# Patient Record
Sex: Male | Born: 1985 | Marital: Married | Smoking: Never smoker
Health system: Southern US, Community
[De-identification: ages and names within clinical notes are randomized; demographics above are authoritative.]

## PROBLEM LIST (undated history)

## (undated) DIAGNOSIS — E663 Overweight: Secondary | ICD-10-CM

## (undated) DIAGNOSIS — I4891 Unspecified atrial fibrillation: Secondary | ICD-10-CM

## (undated) DIAGNOSIS — K219 Gastro-esophageal reflux disease without esophagitis: Secondary | ICD-10-CM

## (undated) DIAGNOSIS — E7800 Pure hypercholesterolemia, unspecified: Secondary | ICD-10-CM

## (undated) HISTORY — DX: Pure hypercholesterolemia, unspecified: E78.00

## (undated) HISTORY — DX: Unspecified atrial fibrillation: I48.91

## (undated) HISTORY — DX: Overweight: E66.3

## (undated) HISTORY — PX: WISDOM TOOTH EXTRACTION: SHX21

## (undated) HISTORY — PX: MOLE REMOVAL: SHX2046

## (undated) HISTORY — DX: Gastro-esophageal reflux disease without esophagitis: K21.9

---

## 2014-04-20 ENCOUNTER — Inpatient Hospital Stay: Payer: Self-pay | Admitting: Internal Medicine

## 2014-04-20 DIAGNOSIS — I4891 Unspecified atrial fibrillation: Secondary | ICD-10-CM

## 2014-04-20 DIAGNOSIS — E785 Hyperlipidemia, unspecified: Secondary | ICD-10-CM

## 2014-04-20 LAB — LIPID PANEL
Cholesterol: 202 mg/dL — ABNORMAL HIGH (ref 0–200)
HDL Cholesterol: 32 mg/dL — ABNORMAL LOW (ref 40–60)
LDL CHOLESTEROL, CALC: 121 mg/dL — AB (ref 0–100)
TRIGLYCERIDES: 243 mg/dL — AB (ref 0–200)
VLDL CHOLESTEROL, CALC: 49 mg/dL — AB (ref 5–40)

## 2014-04-20 LAB — CBC
HCT: 49.9 % (ref 40.0–52.0)
HGB: 17.1 g/dL (ref 13.0–18.0)
MCH: 30.7 pg (ref 26.0–34.0)
MCHC: 34.4 g/dL (ref 32.0–36.0)
MCV: 89 fL (ref 80–100)
Platelet: 227 10*3/uL (ref 150–440)
RBC: 5.59 10*6/uL (ref 4.40–5.90)
RDW: 13.2 % (ref 11.5–14.5)
WBC: 8.5 10*3/uL (ref 3.8–10.6)

## 2014-04-20 LAB — DRUG SCREEN, URINE
Amphetamines, Ur Screen: NEGATIVE (ref ?–1000)
Barbiturates, Ur Screen: NEGATIVE (ref ?–200)
Benzodiazepine, Ur Scrn: NEGATIVE (ref ?–200)
COCAINE METABOLITE, UR ~~LOC~~: NEGATIVE (ref ?–300)
Cannabinoid 50 Ng, Ur ~~LOC~~: POSITIVE (ref ?–50)
MDMA (Ecstasy)Ur Screen: NEGATIVE (ref ?–500)
Methadone, Ur Screen: NEGATIVE (ref ?–300)
OPIATE, UR SCREEN: NEGATIVE (ref ?–300)
PHENCYCLIDINE (PCP) UR S: NEGATIVE (ref ?–25)
TRICYCLIC, UR SCREEN: NEGATIVE (ref ?–1000)

## 2014-04-20 LAB — BASIC METABOLIC PANEL
Anion Gap: 8 (ref 7–16)
BUN: 16 mg/dL (ref 7–18)
CALCIUM: 9.7 mg/dL (ref 8.5–10.1)
CREATININE: 1.02 mg/dL (ref 0.60–1.30)
Chloride: 107 mmol/L (ref 98–107)
Co2: 23 mmol/L (ref 21–32)
GLUCOSE: 92 mg/dL (ref 65–99)
OSMOLALITY: 277 (ref 275–301)
Potassium: 3.4 mmol/L — ABNORMAL LOW (ref 3.5–5.1)
SODIUM: 138 mmol/L (ref 136–145)

## 2014-04-20 LAB — TSH: Thyroid Stimulating Horm: 1.4 u[IU]/mL

## 2014-04-20 LAB — TROPONIN I
Troponin-I: <0.02 ng/mL
Troponin-I: <0.02 ng/mL

## 2014-04-20 LAB — D-DIMER(ARMC): D-Dimer: 263 ng/ml

## 2014-04-20 LAB — MAGNESIUM: MAGNESIUM: 1.7 mg/dL — AB

## 2014-04-21 DIAGNOSIS — I4891 Unspecified atrial fibrillation: Secondary | ICD-10-CM

## 2014-04-21 LAB — BASIC METABOLIC PANEL
ANION GAP: 5 — AB (ref 7–16)
BUN: 13 mg/dL (ref 7–18)
CHLORIDE: 109 mmol/L — AB (ref 98–107)
CO2: 26 mmol/L (ref 21–32)
CREATININE: 1.09 mg/dL (ref 0.60–1.30)
Calcium, Total: 9.3 mg/dL (ref 8.5–10.1)
GLUCOSE: 103 mg/dL — AB (ref 65–99)
OSMOLALITY: 280 (ref 275–301)
POTASSIUM: 4.8 mmol/L (ref 3.5–5.1)
SODIUM: 140 mmol/L (ref 136–145)

## 2014-04-21 LAB — CBC WITH DIFFERENTIAL/PLATELET
Basophil #: 0 10*3/uL (ref 0.0–0.1)
Basophil %: 0.3 %
EOS PCT: 0.9 %
Eosinophil #: 0.1 10*3/uL (ref 0.0–0.7)
HCT: 47.8 % (ref 40.0–52.0)
HGB: 16 g/dL (ref 13.0–18.0)
LYMPHS ABS: 3.6 10*3/uL (ref 1.0–3.6)
Lymphocyte %: 43.6 %
MCH: 30.1 pg (ref 26.0–34.0)
MCHC: 33.5 g/dL (ref 32.0–36.0)
MCV: 90 fL (ref 80–100)
Monocyte #: 0.8 x10 3/mm (ref 0.2–1.0)
Monocyte %: 9.3 %
Neutrophil #: 3.7 10*3/uL (ref 1.4–6.5)
Neutrophil %: 45.9 %
Platelet: 221 10*3/uL (ref 150–440)
RBC: 5.31 10*6/uL (ref 4.40–5.90)
RDW: 12.9 % (ref 11.5–14.5)
WBC: 8.2 10*3/uL (ref 3.8–10.6)

## 2014-04-26 ENCOUNTER — Telehealth: Payer: Self-pay | Admitting: *Deleted

## 2014-04-26 NOTE — Telephone Encounter (Signed)
LVM for TCM  

## 2014-05-12 ENCOUNTER — Encounter: Payer: Self-pay | Admitting: *Deleted

## 2014-05-13 ENCOUNTER — Ambulatory Visit (INDEPENDENT_AMBULATORY_CARE_PROVIDER_SITE_OTHER): Payer: BC Managed Care – PPO | Admitting: Nurse Practitioner

## 2014-05-13 ENCOUNTER — Encounter: Payer: Self-pay | Admitting: Nurse Practitioner

## 2014-05-13 VITALS — BP 120/78 | HR 70 | Ht 76.0 in | Wt 248.0 lb

## 2014-05-13 DIAGNOSIS — I4891 Unspecified atrial fibrillation: Secondary | ICD-10-CM | POA: Insufficient documentation

## 2014-05-13 DIAGNOSIS — K219 Gastro-esophageal reflux disease without esophagitis: Secondary | ICD-10-CM | POA: Insufficient documentation

## 2014-05-13 DIAGNOSIS — E663 Overweight: Secondary | ICD-10-CM | POA: Insufficient documentation

## 2014-05-13 DIAGNOSIS — E7800 Pure hypercholesterolemia, unspecified: Secondary | ICD-10-CM

## 2014-05-13 NOTE — Patient Instructions (Addendum)
Your physician wants you to follow-up in: 6 months with Dr. Gollan. You will receive a reminder letter in the mail two months in advance. If you don't receive a letter, please call our office to schedule the follow-up appointment.    Your physician recommends that you continue on your current medications as directed. Please refer to the Current Medication list given to you today.     

## 2014-05-13 NOTE — Progress Notes (Signed)
   Patient Name: Edwin Fleming Date of Encounter: 05/13/2014  Primary Care Provider:  Elijio Miles, MD Primary Cardiologist:  Concha Se, MD   Patient Profile  28 y/o male who was hospitalized in April with PAF.  Problem List   Past Medical History  Diagnosis Date  . Hypercholesterolemia     a. on tricor  . GERD (gastroesophageal reflux disease)   . A-fib     a. Dx 03/2014 - CHADS2= 0-->broke with amio in hospital, now on Dilt CD;  b. 03/2014 Echo: EF 55-60%, borderline concentric LVH.  Marland Kitchen Overweight    Past Surgical History  Procedure Laterality Date  . Wisdom tooth extraction    . Mole removal      Allergies  No Known Allergies  HPI  28 y/o male with the above problem list.  In mid-April, he was partying with some friends and experimented with MDMA Kirt Boys).  His wife became tachycardic and was seen in the ED.  He did not have any ill effects.  About 2 wks later however, he developed palpitations while @ home.  He presented to the [facility] ED and was found to be in afib.  He was treated with diltiazem with a plan to add oral amiodarone however he converted prior to receiving any amio.  Echo showed nl LV fxn.  CHA2DS2VASC = 0.  He was discharged on dilt CD 120mg  daily.  Following d/c, he did have some constipation but has increased his fluid intake and added more fiber to his diet with relative resolution of constipation.  Overall, he is tolerating diltiazem well and had not had any recurrent palpitations/afib.  He has ceased from using marijuana and alcohol.  He denies chest pain, palpitations, dyspnea, pnd, orthopnea, n, v, dizziness, syncope, edema, weight gain, or early satiety.   Home Medications  Prior to Admission medications   Medication Sig Start Date End Date Taking? Authorizing Provider  diltiazem (CARDIZEM CD) 120 MG 24 hr capsule Take 120 mg by mouth daily.   Yes Historical Provider, MD  fenofibrate 160 MG tablet Take 160 mg by mouth daily.   Yes Historical Provider, MD     Review of Systems  Constipation as above.  Otherwise all systems reviewed and are otherwise negative except as noted above.  Physical Exam  Blood pressure 120/78, pulse 70, height 6\' 4"  (1.93 m), weight 248 lb (112.492 kg).  General: Pleasant, NAD Psych: Normal affect. Neuro: Alert and oriented X 3. Moves all extremities spontaneously. HEENT: Normal  Neck: Supple without bruits or JVD. Lungs:  Resp regular and unlabored, CTA. Heart: RRR no s3, s4, or murmurs. Abdomen: Soft, non-tender, non-distended, BS + x 4.  Extremities: No clubbing, cyanosis or edema. DP/PT/Radials 2+ and equal bilaterally.  Accessory Clinical Findings  ECG - RSR, 70, no acute ST/T changes.  Assessment & Plan  1.  PAF:  Maintaining sinus on dilt cd.  Tolerating reasonably well after experiencing some constipation initially.  CHA2DS2VASc = 0.  No oral anticoagulation indicated.  We discussed the importance of continued drug and alcohol cessation.  He has completely cut out MJ, Molly, and etoh.  2.  HL:  Followed by his PCP.  He is on tricor.  3.  Dispo:  F/U Dr. Mariah Milling in six mos or sooner if necessary.   Ok Anis, NP 05/13/2014, 1:45 PM

## 2014-05-31 ENCOUNTER — Other Ambulatory Visit: Payer: Self-pay

## 2014-05-31 MED ORDER — DILTIAZEM HCL ER COATED BEADS 120 MG PO CP24: 120.0000 mg | ORAL_CAPSULE | Freq: Every day | ORAL | Status: DC

## 2014-06-23 ENCOUNTER — Other Ambulatory Visit: Payer: Self-pay

## 2014-06-23 MED ORDER — DILTIAZEM HCL ER COATED BEADS 120 MG PO CP24: 120.0000 mg | ORAL_CAPSULE | Freq: Every day | ORAL | Status: DC

## 2014-07-26 ENCOUNTER — Other Ambulatory Visit: Payer: Self-pay | Admitting: *Deleted

## 2014-07-26 MED ORDER — DILTIAZEM HCL ER COATED BEADS 120 MG PO CP24: 120.0000 mg | ORAL_CAPSULE | Freq: Every day | ORAL | Status: DC

## 2014-07-26 NOTE — Telephone Encounter (Signed)
90 day supply

## 2015-04-15 NOTE — H&P (Signed)
PATIENT NAME:  Edwin Fleming, [name] MR#:  [id number] DATE OF BIRTH:  07-07-1986  DATE OF ADMISSION:  04/20/2014  PRIMARY CARE PHYSICIAN: None.   REFERRING EMERGENCY ROOM PHYSICIAN: Loraine LericheMark R. Quale, MD  CHIEF COMPLAINT: Chest tightness and palpitations.   HISTORY OF PRESENT ILLNESS: This is a 29 year old male with past medical history of hypercholesterolemia, on fenofibrate, living his active and healthy life. Today morning after dropping his son for school, he started feeling chest tightness around 7:30 in the morning. He was also feeling somewhat short of breath with palpitations and lightheadedness. He decided to remain in the bed. He stayed in the bed for 3 to 4 hours, up to 11 a.m. The complaint was still there, so he decided to come to the Emergency Room. In the ER, he was found having atrial fibrillation with rapid ventricular response, rate ranging about 150 and 160. Given one injection of Cardizem 15 mg, which helped to slow down the heart rate to 100, and so given as admission to hospitalist team. On further questioning, he denied any similar episode in the past. Denied excessive coffee or chocolate consumption. Agrees that he uses marijuana almost every day, but this thing never happened before to him. When I went to examine him, his heart rate was again ranging from 130 and 140, so spoke to nurse and gave an injection of Cardizem 15 mg again, and that helped to slow down the heart rate to 100.  REVIEW OF SYSTEMS:   CONSTITUTIONAL: Negative for fever, fatigue, weakness. Has chest tightness. No weight loss or weight gain.  EYES: No blurring, double vision, discharge or redness.  EARS, NOSE, THROAT: No tinnitus, ear pain or hearing loss.  RESPIRATORY: No cough, wheezing, hemoptysis or shortness of breath.  CARDIOVASCULAR: The patient had chest tightness, palpitations, and feeling of lightheaded. GASTROINTESTINAL: No nausea, vomiting, diarrhea, abdominal pain.  GENITOURINARY: No dysuria, hematuria  or increased frequency.  ENDOCRINE: No heat or cold intolerance. No excessive sweating.  SKIN: No acne or rashes.  MUSCULOSKELETAL: No pain or swelling in the joints.  NEUROLOGICAL: No numbness, weakness, tremor or vertigo. PSYCHIATRIC: No anxiety, insomnia, bipolar disorder.   PAST MEDICAL HISTORY: Hypercholesterolemia.   PAST SURGICAL HISTORY: Wisdom tooth removal.  HOME MEDICATIONS: Fenofibrate.   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 Does not smoke. Drinks 1 to 2 beers almost every day and does marijuana almost every day.   FAMILY HISTORY: Positive for heart disease and stroke on mother's side of family and cancer in father's side of family.   PHYSICAL EXAMINATION: VITAL SIGNS: In ER, temperature 98.4. Pulse 93, went up to 130 and 140, and then finally slowed down to 100. Blood pressure 143/99. Pulse oximetry 100% on room air.  GENERAL: The patient is fully alert and oriented to time, place, and person. Does not appear in any acute distress.  HEENT: Head and neck atraumatic. Conjunctivae pink. Oral mucosa moist.  NECK: Supple. No JVD.  RESPIRATORY: Bilateral equal and clear air entry.  CARDIOVASCULAR: S1, S2 present, irregular. No murmur present. Tachycardia.  ABDOMEN: Soft, nontender. Bowel sounds present. No organomegaly.  MUSCULOSKELETAL: Joints: No swelling or tenderness.  CENTRAL NERVOUS SYSTEM: Power 5/5. Follows commands. Moves all 4 limbs. No gross abnormality. No tremors.  PSYCHIATRIC: Does not appear in any acute psychiatric illness at this time.  IMPORTANT LABORATORY, DIAGNOSTIC, AND RADIOLOGICAL DATA: Glucose 92, BUN 16, creatinine 1.02, sodium 138, potassium 3.4; chloride is 107; CO2 is 23. LDL is 121. Calcium is 9.7. Cholesterol 202, triglycerides  243; HDL is 32. Troponin less than 0.02. TSH is 1.4. Urine for toxicology is positive for cannabinoid. WBC 8.5, hemoglobin 17.1, platelet count 227; MCV is 89. D-dimer is 263. Chest x-ray, portable: No acute finding. EKG  shows A. fib with ventricular rapid response, rate up to 120.   ASSESSMENT AND PLAN: This is a 29 year old male with past history of hypercholesterolemia, came to Emergency Room with atrial fibrillation with rapid ventricular response, responded to 2 injections of Cardizem.  1.  Atrial fibrillation with rapid ventricular response: He responded to Cardizem injections twice. Will keep him on Cardizem 60 mg oral every 6 hours and will get cardiology consult. TSH is normal. Urine for toxicology is positive for cannabinoid. Will get echocardiogram done and wait for cardiology evaluation. Whether to start him on anticoagulation or not, I will leave it up to cardiologist, as he has CHADS2 score very low.  2.  Hypokalemia: Will replace it orally and will check magnesium level.  3.  Hypercholesterolemia: He is already on fenofibrate. Still, his total cholesterol is high. I will start him on simvastatin.   CODE STATUS: Full code.   TOTAL TIME SPENT ON THIS ADMISSION: 50 minutes critical care because of requirement of IV rate-controlling medication. Will continue monitoring on telemetry and if he starts having tachycardia again, he might need to transfer to stepdown unit with IV Cardizem drip. Plan explained to the patient and his mother, who is present in the room.    ____________________________ Edwin Fleming Elisabeth Pigeon, MD [id number] D: 04/20/2014 15:08:20 ET T: 04/20/2014 15:25:34 ET JOB#: [id number]  cc: Edwin Fleming. Elisabeth Pigeon, MD, <Dictator> Altamese Dilling MD ELECTRONICALLY SIGNED 05/02/2014 22:16

## 2015-04-15 NOTE — Consult Note (Signed)
General Aspect 29 year old male with past medical history of hypercholesterolemia, on fenofibrate, who woke with tachypalpitations, SOB.  Cardiology was consulted for atrial fibrillation with RVR.  This  morning at 7:30 AM after dropping his son at school, he started feeling chest tightness. He was also feeling somewhat short of breath with palpitations and lightheadedness. He went to bed for 3 to 4 hours, up to 11 a.m. Symptoms persisted and he decided to come to the Emergency Room.   In the ER, he was in atrial fibrillation with rapid ventricular response, rate ranging about 150 and 160. He was given one injection of Cardizem 15 mg, with improvemtn of rate to 100.   On further questioning, he denied any similar episode in the past. Something happened when he was in high school with something similart "yet different".  Denied excessive coffee or chocolate consumption. Agrees that he uses marijuana almost every day, but this thing never happened before to him.  He currently feels better, still not at baseline.   Present Illness . 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 Drinks 1 to 2 beers almost every day and does marijuana almost every day.   FAMILY HISTORY: Positive for heart disease and stroke on mother's side of family and cancer in father's side of family.   Physical Exam:  GEN well developed, well nourished, no acute distress   HEENT hearing intact to voice, moist oral mucosa   NECK supple   RESP normal resp effort  clear BS   CARD Irregular rate and rhythm  Tachycardic  No murmur   ABD denies tenderness  soft   LYMPH negative neck   EXTR negative edema   SKIN normal to palpation   NEURO motor/sensory function intact   PSYCH alert, A+O to time, place, person, good insight   Review of Systems:  Subjective/Chief Complaint tachypalpitations, SOB   General: Weakness   Skin: No Complaints   ENT: No Complaints   Eyes: No Complaints   Neck: No  Complaints   Respiratory: Short of breath   Cardiovascular: Palpitations  Dyspnea   Gastrointestinal: No Complaints   Genitourinary: No Complaints   Vascular: No Complaints   Musculoskeletal: No Complaints   Neurologic: No Complaints   Hematologic: No Complaints   Endocrine: No Complaints   Psychiatric: No Complaints   Review of Systems: All other systems were reviewed and found to be negative   Medications/Allergies Reviewed Medications/Allergies reviewed   Family & Social History:  Family and Social History:  Family History Non-Contributory  Negative   Social History positive  tobacco, positive  tobacco (Current within 1 year), MAJ   Place of Living Home     GERD - Esophageal Reflux:    Hypercholesterolemia:    None, patient reports no surgical history.:        Admit Diagnosis:   A FIB: Onset Date: 20-Apr-2014, Status: Active, Description: A FIB  Home Medications: Medication Instructions Status  fenofibrate 160 mg oral tablet 1 tab(s) orally once a day (at bedtime) Active   Lab Results:  Thyroid:  29-Apr-15 12:22   Thyroid Stimulating Hormone 1.40 (0.45-4.50 (International Unit)  ----------------------- Pregnant patients have  different reference  ranges for TSH:  - - - - - - - - - -  Pregnant, first trimetser:  0.36 - 2.50 uIU/mL)  Routine Chem:  29-Apr-15 12:22   Magnesium, Serum  1.7 (1.8-2.4 THERAPEUTIC RANGE: 4-7 mg/dL TOXIC: > 10 mg/dL  -----------------------)  Cholesterol, Serum  202  Triglycerides, Serum  243  HDL (INHOUSE)  32  VLDL Cholesterol Calculated  49  LDL Cholesterol Calculated  121 (Result(s) reported on 20 Apr 2014 at 03:13PM.)  Glucose, Serum 92  BUN 16  Creatinine (comp) 1.02  Sodium, Serum 138  Potassium, Serum  3.4  Chloride, Serum 107  CO2, Serum 23  Calcium (Total), Serum 9.7  Anion Gap 8  Osmolality (calc) 277  eGFR (African American) >60  eGFR (Non-African American) >60 (eGFR values <45m/min/1.73 m2  may be an indication of chronic kidney disease (CKD). Calculated eGFR is useful in patients with stable renal function. The eGFR calculation will not be reliable in acutely ill patients when serum creatinine is changing rapidly. It is not useful in  patients on dialysis. The eGFR calculation may not be applicable to patients at the low and high extremes of body sizes, pregnant women, and vegetarians.)  Urine Drugs:  [id number]  Tricyclic Antidepressant, Ur Qual (comp) NEGATIVE (Result(s) reported on 20 Apr 2014 at 03:16PM.)  Amphetamines, Urine Qual. NEGATIVE  MDMA, Urine Qual. NEGATIVE  Cocaine Metabolite, Urine Qual. NEGATIVE  Opiate, Urine qual NEGATIVE  Phencyclidine, Urine Qual. NEGATIVE  Cannabinoid, Urine Qual. POSITIVE  Barbiturates, Urine Qual. NEGATIVE  Benzodiazepine, Urine Qual. NEGATIVE (----------------- The URINE DRUG SCREEN provides only a preliminary, unconfirmed analytical test result and should not be used for non-medical  purposes.  Clinical consideration and professional judgment should be  applied to any positive drug screen result due to possible interfering substances.  A more specific alternate chemical method must be used in order to obtain a confirmed analytical result.  Gas chromatography/mass spectrometry (GC/MS) is the preferred confirmatory method.)  Methadone, Urine Qual. NEGATIVE  Cardiac:  29-Apr-15 12:22   Troponin I < 0.02 (0.00-0.05 0.05 ng/mL or less: NEGATIVE  Repeat testing in 3-6 hrs  if clinically indicated. >0.05 ng/mL: POTENTIAL  MYOCARDIAL INJURY. Repeat  testing in 3-6 hrs if  clinically indicated. NOTE: An increase or decrease  of 30% or more on serial  testing suggests a  clinically important change)  Routine Coag:  29-Apr-15 12:22   D-Dimer, Quantitative 263 (INTERPRETATION <> Exclusion of Venous Thromboembolism (VTE) - OUTPATIENT ONLY       (Emergency Department or [place])             0-499 ng/ml (FEU)  : With a  low to intermediate pretest                                  probability for VTE this test result                                  excludes the diagnosis of VTE.             > 499 ng/ml (FEU)  : VTE not excluded; additional work up                                  for VTE is required. <> Testing on Inpatients and Evaluation of Disseminated Intravascular        Coagulation (DIC)             Reference Range:  0-499 ng/ml (FEU))  Routine Hem:  29-Apr-15 12:22   WBC (CBC) 8.5  RBC (CBC) 5.59  Hemoglobin (CBC) 17.1  Hematocrit (CBC) 49.9  Platelet Count (CBC) 227 (Result(s) reported on 20 Apr 2014 at 12:41PM.)  MCV 89  MCH 30.7  MCHC 34.4  RDW 13.2   EKG:  Interpretation EKG with atrial fibrillation with rate 160 bpm, no significant ST or T wave changes   Radiology Results: XRay:    29-Apr-15 12:21, Chest Portable Single View  Chest Portable Single View   REASON FOR EXAM:    chest pain/sob  COMMENTS:       PROCEDURE: DXR - DXR PORTABLE CHEST SINGLE VIEW  - Apr 20 2014 12:21PM     CLINICAL DATA:  Chest pain and shortness of breath.    EXAM:  PORTABLE CHEST - 1 VIEW    COMPARISON:  None.    FINDINGS:  Trachea is midline. Heart size normal. Lungs are clear. No pleural  fluid.   IMPRESSION:  No acute findings.      Electronically Signed    By: Lorin Picket M.D.    On: 04/20/2014 12:55         Verified By: Luretha Rued, M.D.,    No Known Allergies:   Vital Signs/Nurse's Notes: **Vital Signs.:   29-Apr-15 15:15  Vital Signs Type Admission  Temperature Temperature (F) 98.1  Celsius 36.7  Temperature Source oral  Pulse Pulse 115  Respirations Respirations 18  Systolic BP Systolic BP 891  Diastolic BP (mmHg) Diastolic BP (mmHg) 79  Mean BP 92  Pulse Ox % Pulse Ox % 99  Pulse Ox Activity Level  At rest    Impression 29 year old male with past medical history of hypercholesterolemia, on fenofibrate, who woke with tachypalpitations, SOB.  Cardiology was  consulted for atrial fibrillation with RVR.  1) Atrial fibrillation with RVR no precipitating cause noted. borderline low potassium 3.5 MAj smoking Less than 12 hours of atrial fib, will try to convert back to NSR --Will start amiodarone 400 mg po x 1 now, repeat in 6 hours then continue amio 400 mg po BID COntinue diltiazem 60 Q6 --echo pending If NSR not restored, consider D/c with eliquis and above meds outpt follow up  2) Hyperlipidemia on fenofibrate consider low dose statin  3) MAJ smoking recommended cessation   Electronic Signatures: Ida Rogue (MD)  (Signed 29-Apr-15 19:01)  Authored: General Aspect/Present Illness, History and Physical Exam, Review of System, Family & Social History, Past Medical History, Health Issues, Home Medications, Labs, EKG , Radiology, Allergies, Vital Signs/Nurse's Notes, Impression/Plan   Last Updated: 29-Apr-15 19:01 by Ida Rogue (MD)

## 2015-04-15 NOTE — Discharge Summary (Signed)
PATIENT NAME:  Edwin Fleming, Edwin Fleming MR#:  [id number] DATE OF BIRTH:  1986/08/26  DATE OF ADMISSION:  04/20/2014 DATE OF DISCHARGE:  04/21/2014  ADMITTING PHYSICIAN:  Dr. Elisabeth Fleming. DISCHARGING PHYSICIAN:  Edwin Fleming, M.D.   PRIMARY CARE PHYSICIAN: Currently none.   CONSULTATIONS IN THE HOSPITAL: Cardiology consultation by Dr. Julien Nordmannimothy Fleming.   DISCHARGE DIAGNOSES: 1. Atrial fibrillation with rapid ventricular response, converted to normal sinus rhythm at discharge.  2.  Hyperlipidemia.  3.  Marijuana abuse.   DISCHARGE MEDICATIONS:   1.  Fenofibrate 160 mg p.o. daily.  2.  Cardizem 120 mg p.o. daily.   DISCHARGE DIET:  Regular.   DISCHARGE ACTIVITY:  As tolerated.     FOLLOWUP INSTRUCTIONS:  Cardiology followup in 1 to 2 weeks.   LABORATORY DATA AND IMAGING STUDIES PRIOR TO DISCHARGE:   WBC 8.2, hemoglobin 16, hematocrit 47.8, platelet count 221.   Sodium 140, potassium 4.8, chloride 109, bicarb 26, BUN 13, creatinine 1.09, glucose 103 and calcium of 9.3. Troponins were negative. Urine tox screen is positive for marijuana. Lipid profile showing LDL cholesterol of 121, HDL 32, total cholesterol [id number], triglycerides 243. Chest x-ray showing clear lung fields. No acute cardiopulmonary disease. Echo Doppler showing LV ejection fraction of 55% to 60%, concentrate LVH noted.   BRIEF HOSPITAL COURSE:  Mr. Edwin Fleming is a 29 year old young Caucasian male with past medical history significant for marijuana abuse and occasional MDMA use, comes to the hospital secondary to palpitations, noted to be in Afib with RVR.  1.  Paroxysmal atrial fibrillation with rapid ventricular response. I am not sure what the triggering factor is.  With the oral loading of amiodarone, the patient converted back to normal sinus rhythm and has been asymptomatic since then. The patient was started on low-dose Cardizem in the hospital and will be discharged on maintenance. No need for amiodarone because of his young age and  the impending side effects with amiodarone. His CHADS2 score is 0, so he will not be needing any anticoagulation at this time. The patient was seen by cardiology in the hospital and will follow up with them as an outpatient.   His course has been otherwise uneventful in the hospital. He will continue his fenofibrate that he has been taking for hyperlipidemia after discharge as well.   2.  Hyperkalemia; that was replaced in the hospital.   DISCHARGE CONDITION:  Stable.   DISCHARGE DISPOSITION:  Home.   TIME SPENT ON DISCHARGE: 45 minutes.    ____________________________ Edwin Baasadhika Lynda Wanninger, MD rk:dmm D: 04/21/2014 14:09:12 ET T: 04/21/2014 20:16:11 ET JOB#: [id number]  cc: Edwin Baasadhika Raydell Maners, MD, <Dictator> Antonieta Ibaimothy J. Gollan, MD Edwin BaasADHIKA Tylisha Danis MD ELECTRONICALLY SIGNED 05/11/2014 14:01

## 2015-07-24 ENCOUNTER — Telehealth: Payer: Self-pay

## 2015-07-24 ENCOUNTER — Other Ambulatory Visit: Payer: Self-pay | Admitting: Nurse Practitioner

## 2015-07-24 NOTE — Telephone Encounter (Signed)
l mom to call and schedule f/u 

## 2015-07-24 NOTE — Telephone Encounter (Signed)
-----   Message from Sharon G Crespo, CMA sent at 07/24/2015  9:12 AM EDT ----- Patient needs a follow up appointment with Dr. Gollan.  THanks, [place]  

## 2015-08-03 ENCOUNTER — Telehealth: Payer: Self-pay

## 2015-08-03 NOTE — Telephone Encounter (Signed)
-----   Message from Festus Aloe, CMA sent at 07/24/2015  9:12 AM EDT ----- Patient needs a follow up appointment with Dr. Mariah Milling.  THanks, [employer]

## 2015-08-03 NOTE — Telephone Encounter (Signed)
Called pt to schedule past due appt, states he is driving and will call back to schedule

## 2016-01-21 IMAGING — CR DG CHEST 1V PORT
1 series · 1 of 1 positions shown · non-contrast
Comparison: None.

CLINICAL DATA: Chest pain and shortness of breath.

EXAM:
PORTABLE CHEST - 1 VIEW

[ap]
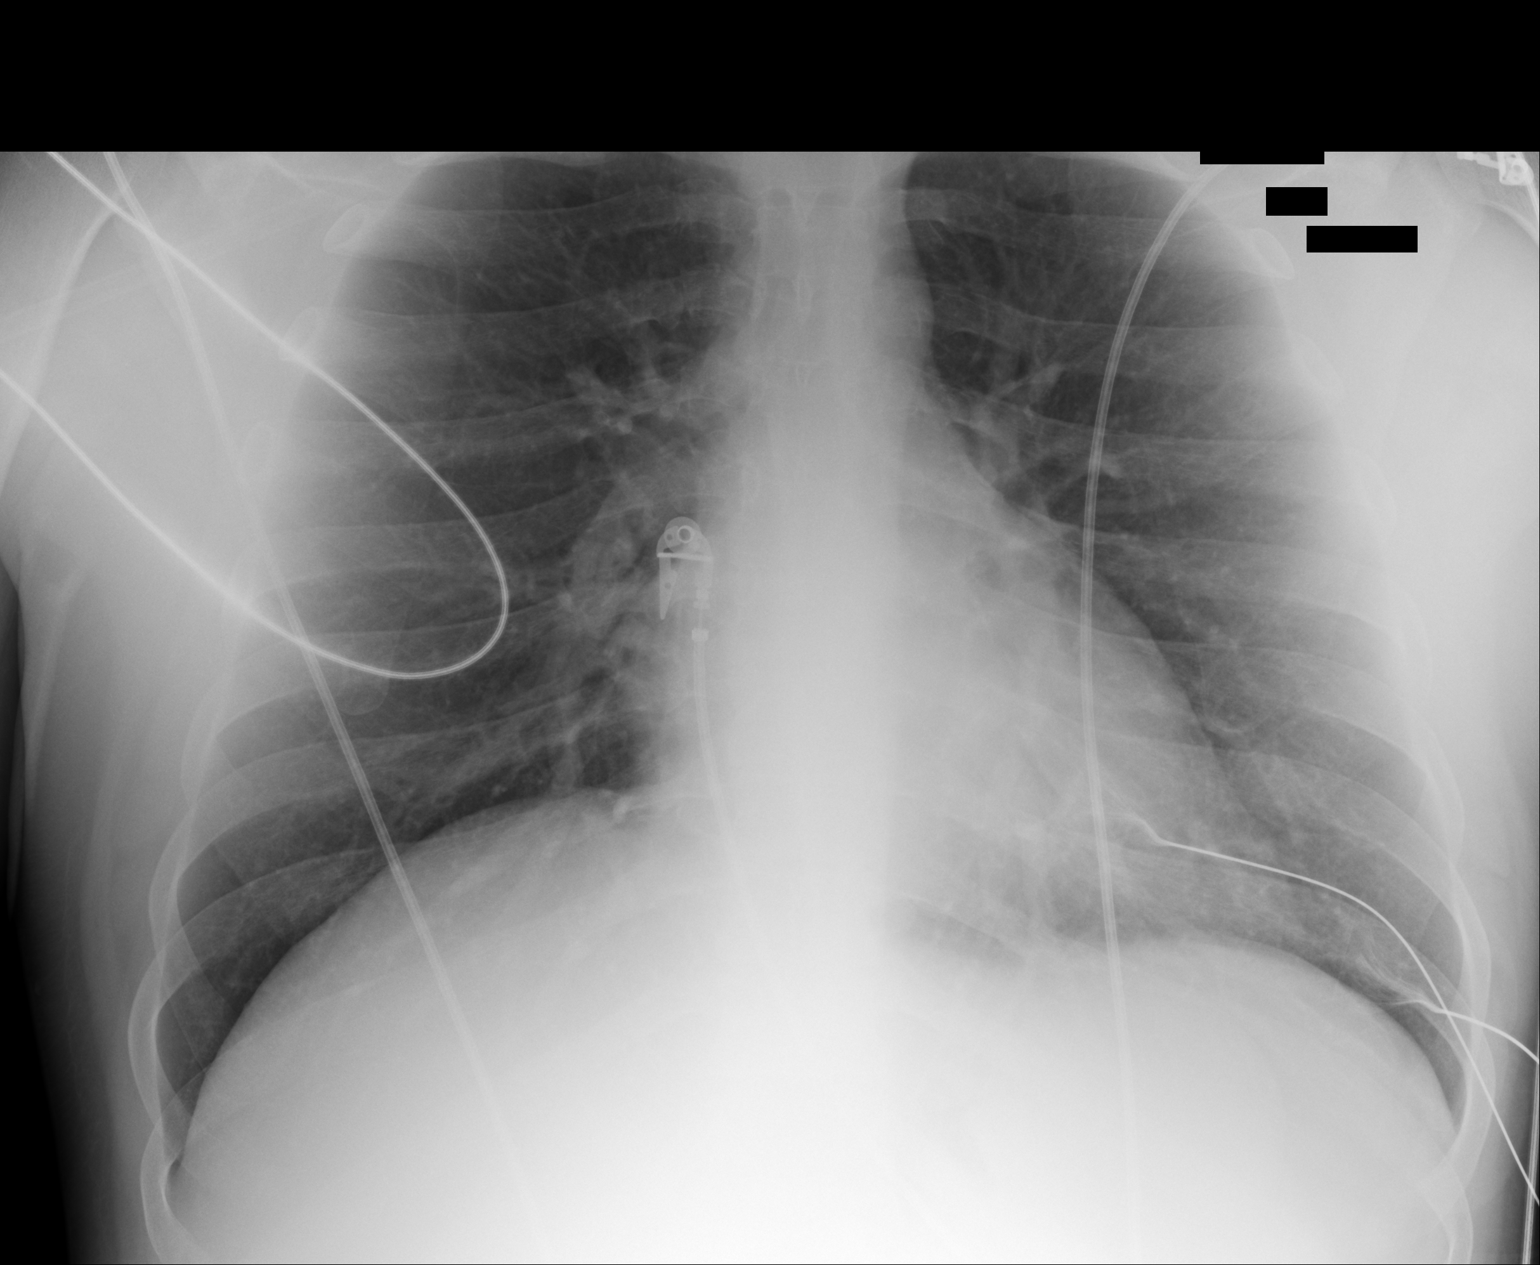

[1 of 1 positions shown; findings below may reference images not displayed]

FINDINGS: Trachea is midline. Heart size normal. Lungs are clear. No pleural
fluid.
IMPRESSION: No acute findings.
